# Patient Record
Sex: Male | Born: 1960 | Race: White | Hispanic: No | Marital: Married | Smoking: Never smoker
Health system: Southern US, Community
[De-identification: ages and names within clinical notes are randomized; demographics above are authoritative.]

---

## 2009-08-23 ENCOUNTER — Ambulatory Visit: Payer: Self-pay | Admitting: Sports Medicine

## 2014-08-11 ENCOUNTER — Emergency Department: Payer: Self-pay | Admitting: Emergency Medicine

## 2016-03-13 IMAGING — CR DG CHEST 1V PORT
1 series · 1 of 1 positions shown · non-contrast
Comparison: None.

CLINICAL DATA: Chest pain

EXAM:
PORTABLE CHEST - 1 VIEW

[ap]
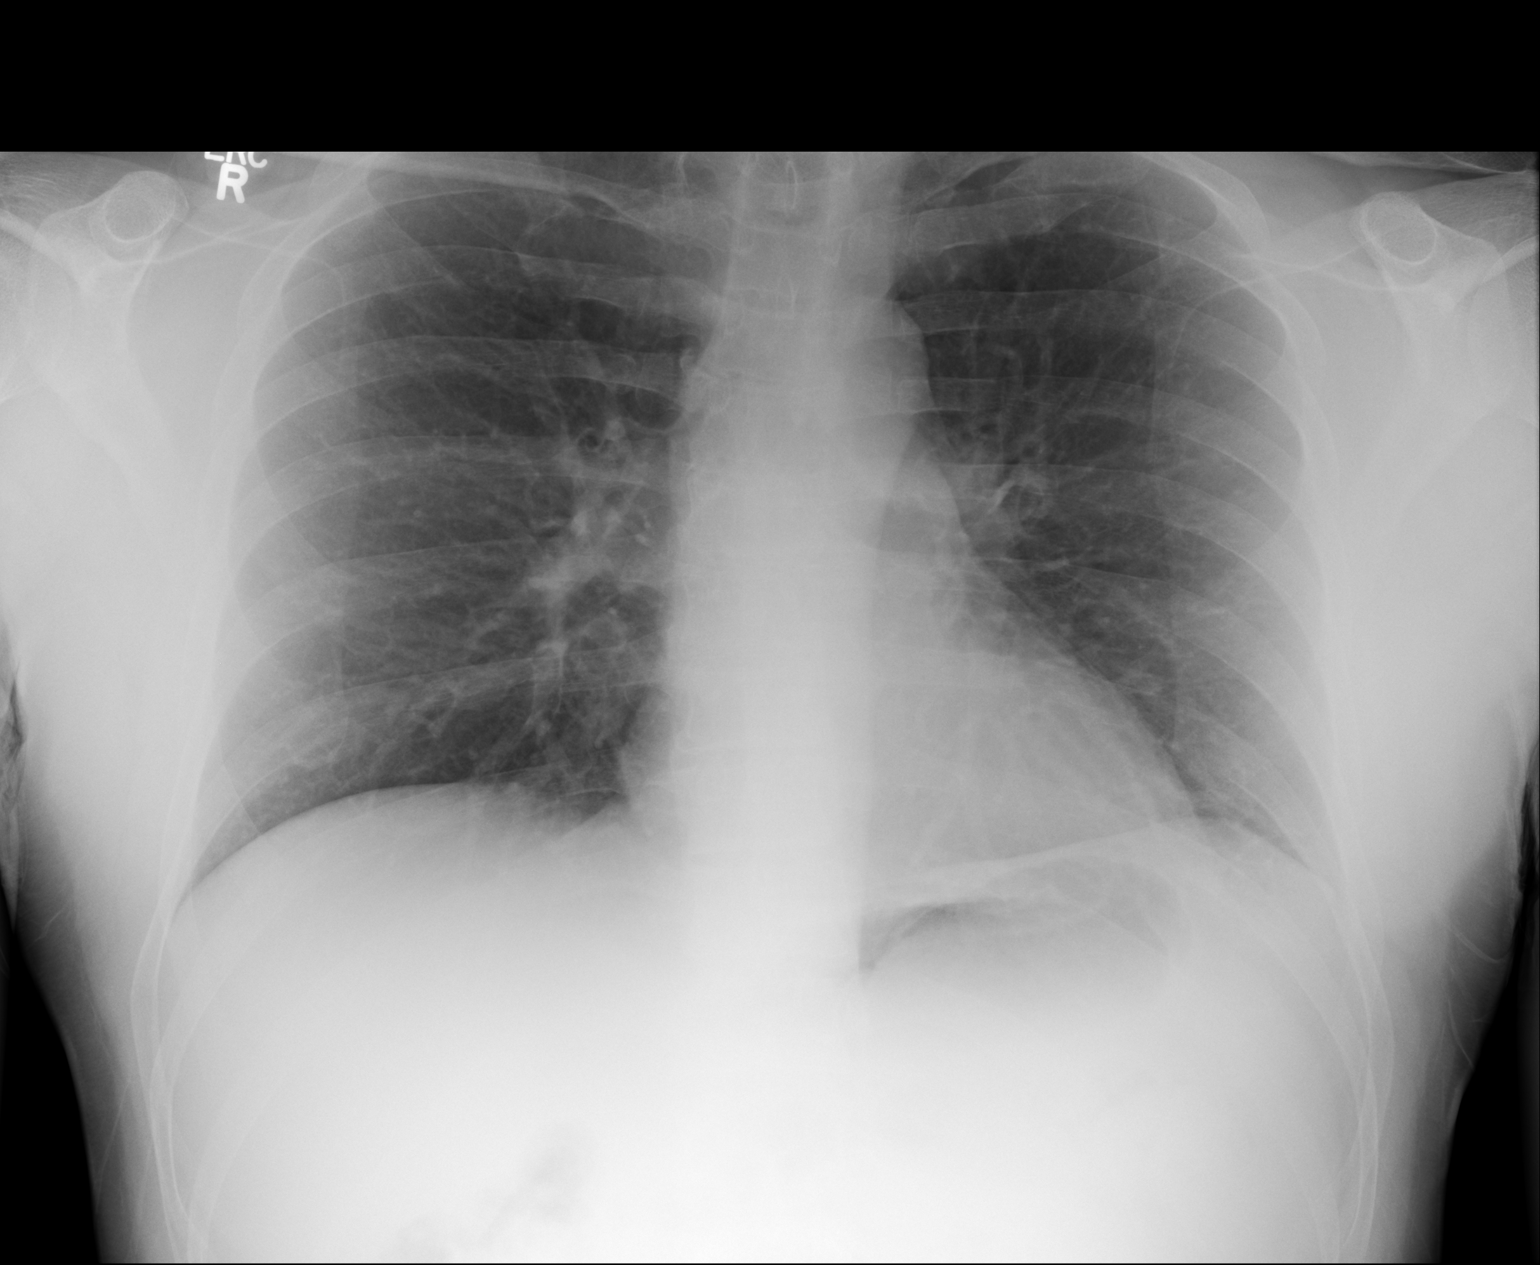

[1 of 1 positions shown; findings below may reference images not displayed]

FINDINGS: The heart size and mediastinal contours are within normal limits.
Both lungs are clear. The visualized skeletal structures are
unremarkable.
IMPRESSION: No active disease.

## 2017-05-23 ENCOUNTER — Encounter: Payer: Self-pay | Admitting: Medical

## 2017-05-23 ENCOUNTER — Ambulatory Visit: Payer: Self-pay | Admitting: Medical

## 2017-05-23 VITALS — BP 126/82 | HR 67 | Temp 98.1°F | Resp 16 | Ht 72.0 in | Wt 189.0 lb

## 2017-05-23 DIAGNOSIS — R05 Cough: Secondary | ICD-10-CM

## 2017-05-23 DIAGNOSIS — R059 Cough, unspecified: Secondary | ICD-10-CM

## 2017-05-23 DIAGNOSIS — J0100 Acute maxillary sinusitis, unspecified: Secondary | ICD-10-CM

## 2017-05-23 DIAGNOSIS — J069 Acute upper respiratory infection, unspecified: Secondary | ICD-10-CM

## 2017-05-23 MED ORDER — BENZONATATE 100 MG PO CAPS: 100.0000 mg | ORAL_CAPSULE | Freq: Three times a day (TID) | ORAL | 0 refills | Status: DC | PRN

## 2017-05-23 MED ORDER — AMOXICILLIN-POT CLAVULANATE 875-125 MG PO TABS: 1.0000 | ORAL_TABLET | Freq: Two times a day (BID) | ORAL | 0 refills | Status: DC

## 2017-05-23 NOTE — Progress Notes (Signed)
   Subjective:    Patient ID: Mitchell Simon, male    DOB: 08/17/1960, 56 y.o.   MRN: [id number]  HPI  56 yo male nonacute distress with nasal congestion and dsicharge green today. starting on Saturday, cough productive green, sneezing. No fever or chills.  Takes Dayquil and Nyquil as directed for symptoms.  No chest pain or shortness of breath.   Review of Systems  Constitutional: Positive for fatigue (little bit). Negative for chills and fever.  HENT: Positive for postnasal drip, rhinorrhea, sinus pressure and sneezing. Negative for congestion, ear pain, sinus pain and sore throat. Trouble swallowing: augmentin.   Eyes: Negative for discharge and itching.  Respiratory: Positive for cough. Negative for chest tightness, shortness of breath and wheezing.   Cardiovascular: Negative for chest pain, palpitations and leg swelling.  Gastrointestinal: Negative for abdominal pain.  Endocrine: Negative for cold intolerance and heat intolerance.  Genitourinary: Negative for dysuria.  Musculoskeletal: Negative for myalgias.  Skin: Negative for rash.  Allergic/Immunologic: Negative for environmental allergies and food allergies.  Neurological: Positive for light-headedness (from lying down alot over the past two days per patient). Negative for dizziness and syncope.  Hematological: Negative for adenopathy.  Psychiatric/Behavioral: Negative for behavioral problems, self-injury and suicidal ideas. The patient is not nervous/anxious.        Objective:   Physical Exam  Constitutional: He is oriented to person, place, and time. He appears well-developed and well-nourished.  HENT:  Head: Normocephalic and atraumatic.  Right Ear: Hearing and ear canal normal. Tympanic membrane is erythematous.  Left Ear: Hearing, external ear and ear canal normal. A middle ear effusion is present.  Nose: Mucosal edema and rhinorrhea present.  Mouth/Throat: Uvula is midline, oropharynx is clear and moist and mucous  membranes are normal.  Eyes: Conjunctivae, EOM and lids are normal. Pupils are equal, round, and reactive to light.  Neck: Normal range of motion. Neck supple.  Cardiovascular: Normal rate, regular rhythm and normal heart sounds. Exam reveals no gallop and no friction rub.  No murmur heard. Pulmonary/Chest: Effort normal and breath sounds normal.  Lymphadenopathy:    He has no cervical adenopathy.  Neurological: He is alert and oriented to person, place, and time.  Skin: Skin is warm and dry.  Psychiatric: He has a normal mood and affect. His behavior is normal. Judgment and thought content normal.  Nursing note and vitals reviewed.  Left sided turbinate swelling , bilateral erythema, yellow/ green d/c on the left side       Assessment & Plan:  Sinusitis/ Upper Respiratory Infection/ cough Meds ordered this encounter  Medications  . amoxicillin-clavulanate (AUGMENTIN) 875-125 MG tablet    Sig: Take 1 tablet by mouth 2 (two) times daily.    Dispense:  20 tablet    Refill:  0  . benzonatate (TESSALON PERLES) 100 MG capsule    Sig: Take 1 capsule (100 mg total) by mouth 3 (three) times daily as needed for cough.    Dispense:  30 capsule    Refill:  0    OTC Zyrtec or Claritin take as directed. Not to take Claritin D with Dayquil or Nyquil. OTC  Flonase take as directed. Return to the clinic in 3-5 days if not improving.  Patient verbalizes understanding and has no questions at discharge.

## 2017-05-23 NOTE — Patient Instructions (Addendum)
Cough, Adult A cough helps to clear your throat and lungs. A cough may last only 2-3 weeks (acute), or it may last longer than 8 weeks (chronic). Many different things can cause a cough. A cough may be a sign of an illness or another medical condition. Follow these instructions at home:  Pay attention to any changes in your cough.  Take medicines only as told by your doctor. ? If you were prescribed an antibiotic medicine, take it as told by your doctor. Do not stop taking it even if you start to feel better. ? Talk with your doctor before you try using a cough medicine.  Drink enough fluid to keep your pee (urine) clear or pale yellow.  If the air is dry, use a cold steam vaporizer or humidifier in your home.  Stay away from things that make you cough at work or at home.  If your cough is worse at night, try using extra pillows to raise your head up higher while you sleep.  Do not smoke, and try not to be around smoke. If you need help quitting, ask your doctor.  Do not have caffeine.  Do not drink alcohol.  Rest as needed. Contact a doctor if:  You have new problems (symptoms).  You cough up yellow fluid (pus).  Your cough does not get better after 2-3 weeks, or your cough gets worse.  Medicine does not help your cough and you are not sleeping well.  You have pain that gets worse or pain that is not helped with medicine.  You have a fever.  You are losing weight and you do not know why.  You have night sweats. Get help right away if:  You cough up blood.  You have trouble breathing.  Your heartbeat is very fast. This information is not intended to replace advice given to you by your health care provider. Make sure you discuss any questions you have with your health care provider. Document Released: 02/24/2011 Document Revised: 11/19/2015 Document Reviewed: 08/20/2014 Elsevier Interactive Patient Education  2018 [employer] Inc. Upper Respiratory Infection,  Adult Most upper respiratory infections (URIs) are caused by a virus. A URI affects the nose, throat, and upper air passages. The most common type of URI is often called "the common cold." Follow these instructions at home:  Take medicines only as told by your doctor.  Gargle warm saltwater or take cough drops to comfort your throat as told by your doctor.  Use a warm mist humidifier or inhale steam from a shower to increase air moisture. This may make it easier to breathe.  Drink enough fluid to keep your pee (urine) clear or pale yellow.  Eat soups and other clear broths.  Have a healthy diet.  Rest as needed.  Go back to work when your fever is gone or your doctor says it is okay. ? You may need to stay home longer to avoid giving your URI to others. ? You can also wear a face mask and wash your hands often to prevent spread of the virus.  Use your inhaler more if you have asthma.  Do not use any tobacco products, including cigarettes, chewing tobacco, or electronic cigarettes. If you need help quitting, ask your doctor. Contact a doctor if:  You are getting worse, not better.  Your symptoms are not helped by medicine.  You have chills.  You are getting more short of breath.  You have brown or red mucus.  You have yellow or   brown discharge from your nose.  You have pain in your face, especially when you bend forward.  You have a fever.  You have puffy (swollen) neck glands.  You have pain while swallowing.  You have white areas in the back of your throat. Get help right away if:  You have very bad or constant: ? Headache. ? Ear pain. ? Pain in your forehead, behind your eyes, and over your cheekbones (sinus pain). ? Chest pain.  You have long-lasting (chronic) lung disease and any of the following: ? Wheezing. ? Long-lasting cough. ? Coughing up blood. ? A change in your usual mucus.  You have a stiff neck.  You have changes in  your: ? Vision. ? Hearing. ? Thinking. ? Mood. This information is not intended to replace advice given to you by your health care provider. Make sure you discuss any questions you have with your health care provider. Document Released: 11/30/2007 Document Revised: 02/14/2016 Document Reviewed: 09/18/2013 Elsevier Interactive Patient Education  2018 [employer]. Sinusitis, Adult Sinusitis is soreness and inflammation of your sinuses. Sinuses are hollow spaces in the bones around your face. They are located:  Around your eyes.  In the middle of your forehead.  Behind your nose.  In your cheekbones.  Your sinuses and nasal passages are lined with a stringy fluid (mucus). Mucus normally drains out of your sinuses. When your nasal tissues get inflamed or swollen, the mucus can get trapped or blocked so air cannot flow through your sinuses. This lets bacteria, viruses, and funguses grow, and that leads to infection. Follow these instructions at home: Medicines  Take, use, or apply over-the-counter and prescription medicines only as told by your doctor. These may include nasal sprays.  If you were prescribed an antibiotic medicine, take it as told by your doctor. Do not stop taking the antibiotic even if you start to feel better. Hydrate and Humidify  Drink enough water to keep your pee (urine) clear or pale yellow.  Use a cool mist humidifier to keep the humidity level in your home above 50%.  Breathe in steam for 10-15 minutes, 3-4 times a day or as told by your doctor. You can do this in the bathroom while a hot shower is running.  Try not to spend time in cool or dry air. Rest  Rest as much as possible.  Sleep with your head raised (elevated).  Make sure to get enough sleep each night. General instructions  Put a warm, moist washcloth on your face 3-4 times a day or as told by your doctor. This will help with discomfort.  Wash your hands often with soap and water. If  there is no soap and water, use hand sanitizer.  Do not smoke. Avoid being around people who are smoking (secondhand smoke).  Keep all follow-up visits as told by your doctor. This is important. Contact a doctor if:  You have a fever.  Your symptoms get worse.  Your symptoms do not get better within 10 days. Get help right away if:  You have a very bad headache.  You cannot stop throwing up (vomiting).  You have pain or swelling around your face or eyes.  You have trouble seeing.  You feel confused.  Your neck is stiff.  You have trouble breathing. This information is not intended to replace advice given to you by your health care provider. Make sure you discuss any questions you have with your health care provider. Document Released: 11/30/2007 Document Revised:  02/07/2016 Document Reviewed: 04/08/2015 Elsevier Interactive Patient Education  2018 Elsevier Inc. Upper Respiratory Infection, Adult Most upper respiratory infections (URIs) are caused by a virus. A URI affects the nose, throat, and upper air passages. The most common type of URI is often called "the common cold." Follow these instructions at home:  Take medicines only as told by your doctor.  Gargle warm saltwater or take cough drops to comfort your throat as told by your doctor.  Use a warm mist humidifier or inhale steam from a shower to increase air moisture. This may make it easier to breathe.  Drink enough fluid to keep your pee (urine) clear or pale yellow.  Eat soups and other clear broths.  Have a healthy diet.  Rest as needed.  Go back to work when your fever is gone or your doctor says it is okay. ? You may need to stay home longer to avoid giving your URI to others. ? You can also wear a face mask and wash your hands often to prevent spread of the virus.  Use your inhaler more if you have asthma.  Do not use any tobacco products, including cigarettes, chewing tobacco, or electronic  cigarettes. If you need help quitting, ask your doctor. Contact a doctor if:  You are getting worse, not better.  Your symptoms are not helped by medicine.  You have chills.  You are getting more short of breath.  You have brown or red mucus.  You have yellow or brown discharge from your nose.  You have pain in your face, especially when you bend forward.  You have a fever.  You have puffy (swollen) neck glands.  You have pain while swallowing.  You have white areas in the back of your throat. Get help right away if:  You have very bad or constant: ? Headache. ? Ear pain. ? Pain in your forehead, behind your eyes, and over your cheekbones (sinus pain). ? Chest pain.  You have long-lasting (chronic) lung disease and any of the following: ? Wheezing. ? Long-lasting cough. ? Coughing up blood. ? A change in your usual mucus.  You have a stiff neck.  You have changes in your: ? Vision. ? Hearing. ? Thinking. ? Mood. This information is not intended to replace advice given to you by your health care provider. Make sure you discuss any questions you have with your health care provider. Document Released: 11/30/2007 Document Revised: 02/14/2016 Document Reviewed: 09/18/2013 Elsevier Interactive Patient Education  2018 [employer]

## 2017-07-13 ENCOUNTER — Ambulatory Visit: Payer: Self-pay | Admitting: Adult Health

## 2017-07-13 ENCOUNTER — Encounter: Payer: Self-pay | Admitting: Adult Health

## 2017-07-13 VITALS — BP 120/70 | HR 61 | Temp 98.0°F | Resp 16 | Ht 72.0 in | Wt 192.0 lb

## 2017-07-13 DIAGNOSIS — Z7184 Encounter for health counseling related to travel: Secondary | ICD-10-CM

## 2017-07-13 LAB — COMPREHENSIVE METABOLIC PANEL
ALBUMIN: 4.7 g/dL (ref 3.5–5.5)
ALT: 23 IU/L (ref 0–44)
AST: 23 IU/L (ref 0–40)
Albumin/Globulin Ratio: 1.5 (ref 1.2–2.2)
Alkaline Phosphatase: 54 IU/L (ref 39–117)
BUN / CREAT RATIO: 11 (ref 9–20)
BUN: 11 mg/dL (ref 6–24)
Bilirubin Total: 0.6 mg/dL (ref 0.0–1.2)
CALCIUM: 9.8 mg/dL (ref 8.7–10.2)
CO2: 27 mmol/L (ref 20–29)
Chloride: 102 mmol/L (ref 96–106)
Creatinine, Ser: 1.02 mg/dL (ref 0.76–1.27)
GFR calc Af Amer: 96 mL/min/{1.73_m2} (ref >59)
GFR calc non Af Amer: 83 mL/min/{1.73_m2} (ref >59)
GLOBULIN, TOTAL: 3.2 g/dL (ref 1.5–4.5)
GLUCOSE: 89 mg/dL (ref 65–99)
Potassium: 4.3 mmol/L (ref 3.5–5.2)
Sodium: 141 mmol/L (ref 134–144)
Total Protein: 7.9 g/dL (ref 6.0–8.5)

## 2017-07-13 MED ORDER — ATOVAQUONE-PROGUANIL HCL 250-100 MG PO TABS: 1.0000 | ORAL_TABLET | Freq: Every day | ORAL | 0 refills | Status: AC

## 2017-07-13 NOTE — Progress Notes (Signed)
Subjective:     Patient ID: Mitchell Simon, male   DOB: 07/06/1960, 57 y.o.   MRN: [id number]  HPI   Patient is a 57 year old male who presents to the clinic in no acute distress.  Patient is here for an appointment for travel counseling.  He reports that he would like to have malaria prophylaxis prior to his trip. Denies having any recent labs drawn, no labs documented in epic.  He is advised he will need kidney function evaluated prior to prescribing Malarone and is in agreement for lab to be drawn today. He denies any recent illness or recent hospitalizations.   He denies the need for traveler's diarrhea medication [place] [facility] [place] [facility] and has Cipro stocked  and readily available if needed. Patient reports that he is healthy and is not receiving treatments for any medical problem at this time.  He denies any kidney or liver disease. No current outpatient medications on file. He denies any current medications on a daily basis. Reviewed immunization history that is documented in Medicat EHR - previous documentation system with patient. Tdap 1 -2 - 2014. Hepatis A  Was given  06/09/2013 and 06/28/2012. Typhoid - oral was last taken 06/28/2012 and this date is explained to be recently expired.  He reports he does not want to receive prescription for Typhoid oral or Typhoid vaccine today in the clinic.   He has reported in Medicat EHR that his Hepatitis B/ 3 series vaccine has been completed- no documentation in system of dates. Today he  reassures provider this is completed.   No other immunization records available and patient does not have with him - he reports he is up to date on his vaccinations.    Patient reports that he leaves for [place] on July 15, 2017 and will return on July 22, 2017 for a total of 7 days.  Patient denies any known layovers in other areas during flight patient reports he is going for a mission trip.  He has previously been to [place] 3  times in the last 5 years.  He also reports that he has been to [place] 20 years ago  Blood pressure 120/70, pulse 61, temperature 98 F (36.7 C), resp. rate 16, height 6' (1.829 m), weight 192 lb (87.1 kg), SpO2 98 %.   Review of Systems  Constitutional: Negative.   HENT: Negative.   Respiratory: Negative.   Cardiovascular: Negative.   Gastrointestinal: Negative.   Genitourinary: Negative.   Musculoskeletal: Negative.   Skin: Negative.   Neurological: Negative.   Hematological: Negative.   Psychiatric/Behavioral: Negative.        Objective:   Physical Exam  Constitutional: He is oriented to person, place, and time. He appears well-developed and well-nourished. No distress.  HENT:  Head: Normocephalic and atraumatic.  Eyes: Conjunctivae are normal. Pupils are equal, round, and reactive to light.  Neck: Normal range of motion. Neck supple. No JVD present. No tracheal deviation present.  Cardiovascular: Normal rate, regular rhythm, normal heart sounds and intact distal pulses. Exam reveals no gallop and no friction rub.  No murmur heard. Pulmonary/Chest: Effort normal and breath sounds normal. No stridor.  Abdominal: Soft. Bowel sounds are normal.  Musculoskeletal: Normal range of motion.  Patient moves on and off of exam table and in room without difficulty. Gait is normal in hall and in room. Patient is oriented to person place time and situation. Patient answers questions appropriately and engages in conversation.   Neurological:  He is alert and oriented to person, place, and time. He has normal reflexes. No cranial nerve deficit. Coordination normal.  Skin: Skin is warm and dry. He is not diaphoretic.  Psychiatric: He has a normal mood and affect. His speech is normal and behavior is normal. Judgment and thought content normal. Cognition and memory are normal.  Nursing note and vitals reviewed.      Assessment:     Travel advice encounter - Plan: Comprehensive metabolic  panel     Plan:     Check CMP at today's visit discussed with office manager and will run stat as patient is leaving on Saturday and needs this medication prior to then  Patient declines typhoid vaccine or prescription for oral vaccine at this time, patient is educated that his Typhoid is expired per guidelines and this can set him up for risk factors including but not limited to extreme illness and even the risk of death.   Also discussed guidelines per Centers for Disease Control ( CDC) recommendations for [place] including Typhoid, all current immunizations per recommendations, TDAP ( he is aware that his is up to date unless he has an injury or cut would need one if greater than five years since last TDAP- 06/28/2012, Hepatitis A documented as complete, Hepatitis B patient reports complete and may have titer drawn for this to prove immunity,   Also discussed with patient CDC recommendation of Cholera vaccine and yellow fever alert on CDC web site not typically affecting the area of [place] he is traveling to at this time, he is advised that this clinic is not a specialty travel clinic and he should also cal [facility] for evaluation prior to travel.   ( Provider did call the [facility] and Glendell DockerSusan Kirks RN returned call that they dod not have Cholera vaccine available and that yellow fever only recommended by their physicians if lay overs in areas of epidemic Travel Clinic  which this patient denies.  He is advised that is this providers recommendation that he still call the travel clinic and speak with them directly prior to travel.    Travel clinic handout given to patient with contact information for travel clinic in [facility] in [place], see media tab.  Patient verbalizes understanding of risk versus benefits of not receiving typhoid vaccine and or oral typhoid and declines either at this time. He also verbalizes understanding that he should follow  clinics guidelines of turning in paperwork for travel evaluation 3-4 weeks prior to travel and scheduling an appointment during that time as well.  He reports that he will call the office to schedule an appointment for next travel health ahead of time as he leaves Saturday for this trip to [place].  Discussed immunity will take over 2 weeks to take effect and this should be taken into consideration for any future trips to lessen his risk. Verbalized understanding, will pend order for Malarone sent to pharmacy once CMP is received and kidney function verified.  Patient prefers [facility]

## 2017-07-13 NOTE — Patient Instructions (Signed)
Malaria Malariais a disease that is caused by a type of single-celled germ that can live inside a person's body (parasite). The malaria parasite is from the Plasmodium family of parasites. This parasite can get into a person's blood when he or she is bitten by a certain type of mosquito (Anopheles mosquito). These mosquitoes are most common in tropical areas of the world. Usually, they are not found in the [place]. If you are bitten by an infected mosquito, the parasites can travel through your blood to your liver. The parasites mature in your liver, then they are released into your blood. They can then invade red blood cells. The parasites multiply inside red blood cells and cause the cells to break open (rupture). This infects more red blood cells. Losing red blood cells may cause you to have a low red blood cell count (anemia). What are the causes? This condition is caused by a Plasmodium parasite. Four different types of the parasite can cause disease in humans. Most cases of malaria come from a mosquito bite, but the disease can also be passed from person to person through blood. What increases the risk? This condition is more likely to develop in:  People who live or travel in an area of the world where malaria is common.  People who receive donated blood (transfusion) or an organ transplant that is contaminated with infected blood cells.  People who share needles with a person who is infected with malaria.  Children.  Pregnant women.  People who have never been exposed to malaria parasites before. These people have not built up protection (immunity) against the parasites.  What are the signs or symptoms? Symptoms can vary depending on which parasite caused the infection. Symptoms usually come and go or occur in cycles. The first symptoms of this condition usually start 10 days to 4 weeks after the mosquito bite. Symptoms for all types of malaria usually happen in this  order:  Chills, along with headache, muscle aches, fatigue, and nausea.  Fever, along with hot and dry skin.  Drenching sweats, along with weakness and exhaustion.  Other symptoms include:  Diarrhea or bloody stools (feces).  Yellowing of the skin and the whites of the eyes (jaundice).  Enlarged spleen or liver.  Severe symptoms include:  Trouble breathing.  Seizures.  Loss of consciousness (coma).  Bleeding.  How is this diagnosed? This condition may be diagnosed based on:  Your symptoms and medical history. Your health care provider may suspect malaria if you have been living or traveling in an area where the disease is common.  A physical exam.  Blood tests to confirm the diagnosis. These may include: ? Examining a blood sample under a microscope. This is the most common way to diagnose malaria. Each type of parasite looks different under the microscope. Identifying which parasite is causing your infection will help your health care provider to determine which medicines will work best. ? Complete blood count (CBC) to check for anemia.  How is this treated? This condition may be treated with many different medicines and combinations of medicines, often requiring treatment in the hospital. The best treatment for you will depend on:  Which type of parasite is causing your infection.  Whether various medicines are effective against it.  How you got the infection.  How severe your infection is.  Your age and your general health.  Follow these instructions at home:  Take over-the-counter and prescription medicines only as told by your health care provider.  Rest at home until your symptoms are under control.  Drink enough fluid to keep your urine clear or pale yellow.  Keep all follow-up visits as told by your health care provider. This is important. How is this prevented?  If you will be traveling to an area where malaria is common: ? Visit the website of  the Centers for Disease Control and Prevention (CDC) to check your risk: KeyPreview.se ? Make an appointment with your health care provider at least 2 weeks before you leave. You may be given a medicine to help prevent malaria.  You can also prevent malaria by: ? Using insect repellent. ? Staying indoors after daytime starts to turn to night or dusk. ? Wearing protective clothing that covers your arms and legs. ? Hanging a mosquito net over your bed. Contact a health care provider if:  You have an attack of malaria symptoms.  You develop jaundice. Get help right away if:  You have a seizure.  You have bleeding.  You have trouble breathing. This information is not intended to replace advice given to you by your health care provider. Make sure you discuss any questions you have with your health care provider. Document Released: 01/26/2004 Document Revised: 01/03/2016 Document Reviewed: 11/20/2015 Elsevier Interactive Patient Education  2018 [employer]. Hepatitis A Hepatitis A is a viral infection of the liver. The virus causes inflammation in the liver and it can be passed from person to person (is contagious). Most cases of hepatitis A are fairly mild and people recover fully. The hepatitis A vaccine can prevent this condition. What are the causes? This condition is caused by the hepatitis A virus (HAV). The virus may be spread by:  Drinking or eating unclean (contaminated) food or water.  Having sex with with someone who is infected.  Coming into contact with the stool (feces) of a person who is infected and passing the virus from your hands to your mouth.  What increases the risk? The following factors make you more likely to develop this condition:  Having contact with contaminated needles or syringes. This may happen during: ? Acupuncture. ? Tattooing. ? Body piercing. ? Injecting drugs.  Not having access to clean water or food.  Working  at a day care or nursing home. Working in these facilities increases the risk of getting this infection because you are in contact with feces during diaper changes or general hygiene practices.  Having HIV (human immunodeficiency virus) or AIDS (acquired immunodeficiency syndrome).  Living in or traveling to countries where hepatitis A is common.  Being a man who has sex with men.  Having oral or anal sex.  Having hemophilia or another blood clotting factor disorder.  Having long-term (chronic) liver disease.  What are the signs or symptoms? Symptoms of this condition include:  Loss of appetite.  Fatigue.  Nausea.  Vomiting.  Stomach pain.  Dark yellow urine.  Yellowing of the skin and eyes (jaundice).  Fever.  Itchy skin.  Light-colored bowel movements.  Joint pain.  In some cases, you may not have any symptoms. How is this diagnosed? This condition is diagnosed based on:  A physical exam.  Your medical history.  Blood tests.  How is this treated? This condition usually goes away on its own over several weeks or months. There is no specific treatment for the disease after the virus has caused the infection. Severe cases of hepatitis A may require hospitalization to treat dehydration and to monitor liver function, but this is  rare. Follow these instructions at home: Medicines  Take over-the-counter and prescription medicines only as told by your health care provider.  Do not take over-the-counter medicines that contain acetaminophen.  Do not take any new medicines, including over-the-counter medicines and supplements, unless approved by your health care provider. Lifestyle  Rest. Make sure you: ? Get plenty of sleep. Avoid staying up late. ? Keep the same bedtime hours on weekends and weekdays. ? Take daytime naps or rest breaks when you feel tired.  Do not have sex unless approved by your health care provider.  Eat a balanced diet with plenty of  fruits and vegetables, whole grains, and low-fat (lean) meats or other non-meat proteins (such as beans or tofu).  Do not drink alcohol until your health care provider approves. General instructions  Wash your hands frequently with soap and water, especially after using the bathroom, after changing diapers, and before handling food or water. If soap and water are not available, use hand sanitizer.  Tell your health care provider about all of the people you live with or with whom you have close contact. Your health care provider may recommend that they receive the hepatitis A vaccine.  Follow your health care provider's instructions about how to avoid spreading the virus.  Ask your health care provider when you may return to school or work.  Keep all follow-up visits as told by your health care provider. This is important. How is this prevented?  Get the hepatitis A vaccine. This helps prevent the hepatitis A infection.  If you have been recently exposed to hepatitis A, your health care provider may recommend that you get a shot of human immunoglobulin or the hepatitis A vaccine. This may help you prevent hepatitis A.  Wash your hands frequently with soap and water, especially after using the bathroom or changing diapers, and before handling food or water. If soap and water are not available, use hand sanitizer.  If you travel to a developing country: ? Avoid raw or under-cooked food. ? Drink bottled water only. ? Use bottled water to brush your teeth, make ice cubes, and wash fruits and vegetables.  Practice safe sex. Always use condoms when having oral, vaginal, or anal sex. Contact a health care provider if:  You have a fever.  Your symptoms get worse. Get help right away if:  You are unable to eat or drink.  You cannot eat or drink without vomiting.  You feel confused.  Your jaundice gets worse.  You are very sleepy or have trouble waking up.  You have uncontrolled  bleeding or bruising. Summary  Hepatitis A is a viral infection of the liver. The virus causes inflammation in the liver and it can be passed from person to person (is contagious).  The hepatitis A virus (HAV) can be spread by drinking or eating unclean (contaminated) food or water.  You should not take any new medicines, including over-the-counter medicines and supplements, unless they are approved by your health care provider.  To help prevent hepatitis A, wash your hands frequently with soap and water, especially after using the bathroom or changing diapers, and before handling food or water. If soap and water are not available, use hand sanitizer. This information is not intended to replace advice given to you by your health care provider. Make sure you discuss any questions you have with your health care provider. Document Released: 06/10/2000 Document Revised: 07/19/2016 Document Reviewed: 07/19/2016 Elsevier Interactive Patient Education  2018 [employer]. Hepatitis  B Hepatitis B is a viral infection of the liver. There are two kinds of hepatitis B:  Acute hepatitis B. This lasts for six months or less.  Chronic hepatitis B. This lasts for more than six months. Chronic hepatitis B can lead to liver failure, scarring of the liver (cirrhosis), or liver cancer.  Acute hepatitis B can turn into chronic hepatitis B. Most adults with acute hepatitis B do not develop chronic hepatitis B. Infants and young children who get hepatitis B are more likely to develop chronic hepatitis B than adults. The hepatitis B vaccine can prevent this condition. What are the causes? This condition is caused by the hepatitis B virus (HBV). The virus may spread from person to person (is contagious) through:  Blood.  Childbirth. A woman who has hepatitis B can pass it to her baby during birth.  Bodily fluids such as breast milk, tears, semen, vaginal fluids, and saliva.  What increases the risk? The  following factors may make you more likely to develop this condition:  Having contact with unclean (contaminated) needles or syringes. This may result from: ? Acupuncture. ? Tattooing. ? Body piercing. ? Injecting drugs.  Having unprotected sex with someone who is infected.  Living with or having close contact with a person who has hepatitis B.  Working in a job that involves contact with blood or bodily fluids, such as health care.  Traveling to a country that has many cases of hepatitis B.  Being on treatment to filter your blood (kidney dialysis).  Having a history of blood transfusions or organ transplants.  What are the signs or symptoms? Symptoms of this condition may include:  Loss of appetite.  Fatigue.  Nausea.  Vomiting.  Stomach pain.  Dark yellow urine.  Yellowish skin and eyes (jaundice).  Fever.  Light-colored or gray bowel movements.  Joint pain.  In some cases, you may not have any symptoms. How is this diagnosed? This condition is diagnosed based on:  A physical exam.  Your medical history.  Blood tests.  How is this treated? Treatment for chronic hepatitis B may include antiviral medicine. This medicine may help:  Lower your risk of liver failure, cirrhosis, or liver cancer.  Lower your ability to infect others with hepatitis B.  You will need to avoid alcohol and medicines that can be hard for the liver to break down (metabolize). This helps prevent further injury to your liver. Follow these instructions at home: Medicines  Take over-the-counter and prescription medicines only as told by your health care provider.  Take your antiviral medicine as told by your health care provider. Do not stop taking the antiviral even if you start to feel better.  Do not take any over-the-counter medicines that contain acetaminophen.  Do not take any new medicines, including over-the-counter medicines for fever or pain, unless approved by your  health care provider. Activity  Rest as needed.  Do not have sex unless approved by your health care provider.  Ask your health care provider when you may return to school or work. Eating and drinking  Eat a balanced diet with plenty of fruits and vegetables, whole grains, and lowfat (lean) meats or other non-meat proteins (such as beans or tofu).  Drink enough fluids to keep your urine clear or pale yellow.  Avoid alcohol. General instructions  Do not share toothbrushes, nail clippers, or razors.  Wash your hands frequently with soap and water. If soap and water are not available, use hand sanitizer.  Keep all follow-up visits as told by your health care provider. This is important. How is this prevented?  Get the hepatitis B vaccine. This helps prevent the hepatitis B infection.  Wash your hands frequently with soap and water. If soap and water are not available, use hand sanitizer.  Do not share needles or syringes.  Practice safe sex and use condoms.  Avoid handling blood or bodily fluids without gloves or other protection.  Avoid getting tattoos or piercings in shops or other locations that are not clean. Contact a health care provider if:  You develop a rash.  You develop jaundice, or your chronic jaundice becomes more severe.  You have a fever. Get help right away if:  You are unable to eat or drink.  You have a fever along with nausea or vomiting.  You feel confused.  You have trouble breathing.  Your skin, throat, mouth, or face becomes swollen.  You have jerky movements that you cannot control (seizure).  You become very sleepy or have trouble waking up.  Your stomach becomes very swollen. Summary  Hepatitis B is a viral infection of the liver. There are two kinds of hepatitis B: acute and chronic.  The hepatitis B virus (HBV) can be passed from person to person (is contagious).  You should not take any new medicines, including  over-the-counter medicines for fever or pain, unless approved by your health care provider.  To help prevent hepatitis B, wash your hands frequently with soap and water. If soap and water are not available, use hand sanitizer. This information is not intended to replace advice given to you by your health care provider. Make sure you discuss any questions you have with your health care provider. Document Released: 06/10/2000 Document Revised: 07/19/2016 Document Reviewed: 07/19/2016 Elsevier Interactive Patient Education  2018 [employer]. Cholera Cholera is an infection of the intestines by the bacterium Vibrio cholerae. The infection can cause excessive passing of watery stools (diarrhea), vomiting, and other symptoms, often leading to severe dehydration. Dehydration is when you lose more fluids from your body than you take in. This can be life-threatening. If you have cholera, it is very important to get treatment as soon as possible. What are the causes? This condition is caused by the bacterium Vibrio cholerae. You can get infected by:  Drinking unclean (contaminated) water.  Eating contaminated food.  Coming into contact with the stool of an infected person and passing the bacteria to your mouth.  What increases the risk? This condition is more likely to develop in:  People who live in or have traveled to areas with poor sanitation.  People who live in areas where there is no access to clean water.  What are the signs or symptoms? Symptoms of this condition may include:  Diarrhea.  Vomiting.  Leg or abdominal cramps.  Nausea.  Unusual tiredness.  Weakness.  Dizziness.  Dry and sticky mouth.  Headache.  Smaller amounts of urine than usual.  Being very thirsty.  Having a fever.  In some cases, there are no symptoms of this condition. How is this diagnosed? This condition may be diagnosed with:  A stool culture test. This is a method in which a stool sample  is checked for bacteria under a microscope. This is the most reliable method of diagnosis.  A rapid diagnostic test. This method may be used in areas where there are limited lab testing resources available. It is less reliable than a stool culture test, but it can  be used to provide early signs that a cholera outbreak may be taking place at a certain location. If possible, the results of a rapid test should be confirmed by a stool culture test.  How is this treated? This condition is treated with fluids to keep your body hydrated. Depending on how sick you are, you may be given fluids:  Through your mouth (oral rehydration therapy).  Through an IV tube inserted into one of your veins.  In some cases, you may also be given antibiotic medicine. Follow these instructions at home:  Take over-the-counter and prescription medicines only as told by your health care provider.  If you were prescribed an antibiotic medicine, take it as told by your health care provider. Do not stop taking the antibiotic even if you start to feel better.  Wash your hands with soap and water often, especially after going to the bathroom and before preparing food. If soap and water are not available, use hand sanitizer.  Drink enough fluids to keep your urine clear or pale yellow.  Keep all follow-up visits as told by your health care provider. This is important. How is this prevented?  In areas of cholera contamination or a cholera outbreak: ? Boil your water before drinking it, or drink bottled water. Do not drink any beverage that has ice in it. ? Do not eat raw vegetables. This includes salad. ? Only eat fruit that you have peeled yourself. Do not eat fruit that cannot be peeled, such as peaches. ? Only eat food that is fully cooked. Do not eat raw fish or shellfish. ? Do not eat food from street vendors.  Always wash your hands with soap and water after going to the bathroom and before preparing food. If soap  and water are not available, use hand sanitizer.  Ask your health care provider about getting the cholera vaccine. Contact a health care provider if:  You have watery diarrhea or other symptoms of cholera.  Your symptoms get worse.  You have new symptoms. Get help right away if:  You faint.  You become confused.  Your eyes look sunken.  You have a rapid heartbeat.  Your breathing is faster than normal.  You do not have any tears while crying.  You are not urinating.  Your urine is very dark.  Your skin feels very dry, and if you pinch it, it stays pinched after you let go. This information is not intended to replace advice given to you by your health care provider. Make sure you discuss any questions you have with your health care provider. Document Released: 03/05/2002 Document Revised: 06/06/2016 Document Reviewed: 06/06/2016 Elsevier Interactive Patient Education  2018 [employer]. Typhoid Fever Typhoid fever is an illness that is caused by the bacteria Salmonella typhi, or S. typhi. Typhoid fever can be a life-threatening condition. What are the causes? In most cases, typhoid infection happens after a person drinks water or eats food that is contaminated with S. typhi bacteria. Typhoid fever can be a problem in areas that have poor sanitation. In countries that have large outbreaks, the problem usually results when street vendor food or water supplies become contaminated with the stool (feces) of an infected person. In some cases, an infected person can continue to spread typhoid bacteria even after he or she no longer has symptoms of the illness. A small percentage of people who have recovered from typhoid fever become chronic carriers. A chronic carrier is a person who does not have, or  who no longer has, symptoms of typhoid fever but continues to excrete typhoid bacteria for more than one year. A chronic carrier may not appear to be ill, but he or she can still spread the  bacteria to other people. What are the signs or symptoms? Symptoms of typhoid fever may include:  High fever. At first, the fever comes and goes. It becomes constant after a few days of illness.  Headache.  Muscle aches and weakness.  Extreme tiredness (fatigue).  Stomach cramps with diarrhea or constipation.  Nosebleed.  Nausea.  Vomiting.  Cough.  Rash.  Confusion or delirium.  Swollen abdomen.  Enlarged liver or spleen.  How is this diagnosed? Your health care provider will do a physical exam and ask you about your travel history and your symptoms. This condition is diagnosed by examining a culture of your stool, urine, blood, or other body tissue under a microscope to check for typhoid bacteria. How is this treated? This condition is treated with antibiotic medicines that may be taken by mouth or may be given through an IV tube. Follow these instructions at home:  Take over-the-counter and prescription medicines only as told by your health care provider.  If you were prescribed an antibiotic medicine, finish all of it even if you start to feel better.  Thoroughly wash your hands with antibacterial soap and warm water after: ? You use the toilet. ? You help a child to use the toilet. ? You change a diaper.  Use a bleach-based household cleaner to disinfect any surfaces of your home or living area that might be contaminated.  Do not prepare food for other people until both of these things have happened: ? You have been treated with antibiotics. ? You have been free of all symptoms for at least one week.  Avoid close contact with other people until you are well.  Follow instructions from your health care provider about how to avoid spreading infection to others. How is this prevented?  Before you travel to a country where typhoid fever is a problem, talk with your health care provider or a travel clinic about getting a vaccination against typhoid. Ask the  provider when the vaccination needs to be completed so that there will be enough time for the vaccine to take effect before you begin traveling.  When you travel in an area where typhoid is common, avoid unsafe foods and drinks: ? Only drink sealed bottled drinks and sealed bottled water. ? Do not use ice in drinks unless the ice was made from bottled or boiled water. ? Do not eat raw fruits or vegetables unless they have a peel and you have peeled them yourself. ? Do not eat raw, rare, or undercooked meat, fish, or eggs. ? When possible, cook your own food. ? Only eat foods that have been cooked thoroughly and kept hot until served. ? Do not eat foods or drink beverages from street vendors.  Avoid contact with sick people. Contact a health care provider if:  Your symptoms seem to be getting worse.  You develop new symptoms.  You have a fever.  Your fever gets worse, or it comes back after it had gone away. Get help right away if:  You develop severe vomiting or diarrhea.  You cannot eat or drink without vomiting. This information is not intended to replace advice given to you by your health care provider. Make sure you discuss any questions you have with your health care provider. Document Released: 09/03/2002  Document Revised: 11/24/2015 Document Reviewed: 10/31/2014 Elsevier Interactive Patient Education  2018 [employer]. TravelVaccine Information Vaccines, also called immunizations, can protect you from certain diseases. Vaccines can also prevent the spread of certain infections. It is important to see your health care provider or a travel medicine specialist 4-6 weeks before you travel. This allows time for recommended vaccines to take effect. It also provides enough time for you to get vaccines that must be given in a series over a period of days or weeks. Vaccines for travelers include:  Routine vaccines. These vaccines are standard.  Recommended travel vaccines. These  vaccines are generally recommended before international travel.  Geographically required travel vaccines. These vaccines are necessary before travel to some countries or regions.  If it is less than 4 weeks before you leave, you should still see your health care provider. You might still benefit from vaccines or medicines. What are routine vaccines? Routine vaccines are shots that can protect you from common diseases in many parts of the world. Most routine vaccines are given at certain ages starting in childhood. Routine vaccines also include the annual flu (influenza) vaccine. It is important that you are up to date on your routine vaccines before you travel. You may be advised to get extra doses, also called booster vaccines, such as the Tdap (tetanus, diphtheria, and pertussis). What are recommended vaccines? Recommended travel vaccines change over time. Your health care provider can tell you what vaccines are recommended before your trip. The most common recommended vaccines before travel are hepatitis A and typhoid vaccines. Know your travel schedule when you visit your health care provider. The vaccines that are recommended before foreign travel will depend on several factors, such as:  The country or countries of travel.  Whether you will be traveling to rural areas.  How long you will be traveling.  The season of the year.  Your age. Older adults should get a vaccine against a certain type of pneumonia (pneumococcal) and a vaccine against shingles (herpes zoster).  Your health status.  Your previous vaccines.  The annual influenza vaccine sometimes differs for the [place] and [place] hemispheres. You should:  Get both vaccines if you are traveling to the other hemisphere, and you have a chronic medical condition.  Get the vaccine shortly before or during the flu season, and only if the vaccine in your country differs from the vaccine in your destination country.  Get the  other influenza vaccine either before leaving the country or shortly after arriving at the destination country.  What are geographically required vaccines? Children should be up to date with all of the recommended vaccinations. Parents should follow the standard vaccination guidelines that are recommended by the pediatrician. Some vaccines may be required during an ongoing outbreak of an infectious disease in a country or region. Your health care provider will be able to tell you about any outbreaks and required vaccines. Some examples of required vaccines include: Yellow fever vaccine  Proof of yellow fever immunization is currently required for most people before traveling to certain countries in [place] and [place].  If proof of immunization is incomplete or inaccurate, you could be quarantined, denied entry, or given another dose of vaccine at the travel site.  This vaccine can only be obtained at approved centers.  You should get the yellow fever vaccine at least 10 days before your trip.  After 10 days, most people show immunity to yellow fever.  If it has been longer than 10  years since you received the yellow fever vaccine, another dose is required.  Meningococcal vaccine  Meningococcal immunization may be required prior to travel to parts of [place] and [place].  Proof of meningococcal immunization is required by the [place] of Health for any person older than age 61 who is taking part in [place] or [place].  Visas for traveling to take part in hajj or umrah will not even be issued until there is proof of immunization. You should get this vaccine at least 10 days before your trip.  After 10 days, most people show immunity.  If it has been longer than 3 years since your last immunization, another dose is required.  Some travel circumstances may require additional vaccination with the following vaccines:  Hepatitis B.  Rabies.  Tick-borne  encephalitis.  Malaria.  Where to find more information:  Centers for Disease Control and Prevention (CDC): FootballExhibition.com.br  World Health Organization [facility]): https://castaneda-walker.com/ This information is not intended to replace advice given to you by your health care provider. Make sure you discuss any questions you have with your health care provider. Document Released: 06/01/2009 Document Revised: 03/08/2016 Document Reviewed: 11/17/2015 Elsevier Interactive Patient Education  [employer].

## 2017-07-13 NOTE — Addendum Note (Signed)
Addended by: Berniece PapFLINCHUM, Vinie Charity S on: 07/13/2017 12:59 PM   Modules accepted: Orders

## 2020-06-11 ENCOUNTER — Encounter: Payer: Self-pay | Admitting: Dermatology

## 2022-07-06 DIAGNOSIS — E785 Hyperlipidemia, unspecified: Secondary | ICD-10-CM | POA: Insufficient documentation

## 2022-07-11 ENCOUNTER — Other Ambulatory Visit: Payer: Self-pay | Admitting: Internal Medicine

## 2022-07-11 DIAGNOSIS — Z0000 Encounter for general adult medical examination without abnormal findings: Secondary | ICD-10-CM

## 2022-07-11 DIAGNOSIS — E785 Hyperlipidemia, unspecified: Secondary | ICD-10-CM

## 2022-07-15 ENCOUNTER — Ambulatory Visit
Admission: RE | Admit: 2022-07-15 | Discharge: 2022-07-15 | Disposition: A | Discharge status: Home / Self Care | Payer: Self-pay | Source: Ambulatory Visit | Attending: Internal Medicine | Admitting: Internal Medicine

## 2022-07-15 DIAGNOSIS — E785 Hyperlipidemia, unspecified: Secondary | ICD-10-CM | POA: Insufficient documentation

## 2022-07-15 DIAGNOSIS — Z0000 Encounter for general adult medical examination without abnormal findings: Secondary | ICD-10-CM | POA: Insufficient documentation

## 2022-07-18 ENCOUNTER — Ambulatory Visit (INDEPENDENT_AMBULATORY_CARE_PROVIDER_SITE_OTHER): Payer: Self-pay | Admitting: Physician Assistant

## 2022-07-18 DIAGNOSIS — U071 COVID-19: Secondary | ICD-10-CM

## 2022-07-18 NOTE — Progress Notes (Signed)
Virtual Visit Consent   Mitchell Simon, you are scheduled for a virtual visit with a [facility] provider today. Just as with appointments in the office, your consent must be obtained to participate. Your consent will be active for this visit and any virtual visit you may have with one of our providers in the next 365 days. If you have a MyChart account, a copy of this consent can be sent to you electronically.  As this is a virtual telephone visit which doesn't allow your provider to perform a traditional examination and may limit your provider's ability to fully assess your condition. If your provider identifies any concerns that need to be evaluated in person or the need to arrange testing (such as labs, EKG, etc.), we will make arrangements to do so. Although advances in technology are sophisticated, we cannot ensure that it will always work on either your end or our end. If the connection with a video visit is poor, the visit may have to be switched to a telephone visit. With either a video or telephone visit, we are not always able to ensure that we have a secure connection.  By engaging in this virtual visit, you consent to the provision of healthcare and authorize for your insurance to be billed (if applicable) for the services provided during this visit. Depending on your insurance coverage, you may receive a charge related to this service.  I need to obtain your verbal consent now. Are you willing to proceed with your visit today? Mitchell Simon has provided verbal consent on 07/18/2022 for a virtual visit (video or telephone). Mitchell Simon, [place]  Date: 07/18/2022 1:07 PM  Virtual Visit via Telephone Note   I, Mitchell Simon, connected with  Jamoni Hewes  ([id number], [birth year]) on 07/18/22 at  1:30 PM EST by a telephone and verified that I am speaking with the correct person using two identifiers.  Location: Patient: Virtual Visit Location Patient: Home Provider: Virtual Visit Location Provider:  Office/Clinic   I discussed the limitations of evaluation and management by telemedicine and the availability of in person appointments. The patient expressed understanding and agreed to proceed.    History of Present Illness: Mitchell Simon is a 62 y.o. who identifies as a male who was assigned male at birth, and is being seen today for covid .  HPI: 62 y/o M c/o cough, fever, and congestion x 3 days. He tested positive for covid yesterday at home.     Problems: There are no problems to display for this patient.   Allergies: No Known Allergies Medications:  Current Outpatient Medications:    atovaquone-proguanil (MALARONE) 250-100 MG TABS tablet, Take 1 tablet by mouth daily. Start medication 2 days before travel and continue on trip and until 7 days after return., Disp: 20 tablet, Rfl: 0  Observations/Objective: Patient is well-developed, well-nourished in no acute distress.  Resting comfortably at home.  Head is normocephalic, atraumatic.  No labored breathing.  Speech is clear and coherent with logical content.  Patient is alert and oriented at baseline.    Assessment and Plan: 1. COVID-19 virus infection  Stay well hydrated Take otc medicine as directed on the box for cough and cold symptoms Reviewed CDC guidelines with patient. Pt will be isolated for 5 days from the start of his symptoms. Needs to be fever free for 24 hours without the aid of medicine before returning back to work. She must wear a mask for an additional 5 days.     Follow  Up Instructions: I discussed the assessment and treatment plan with the patient. The patient was provided an opportunity to ask questions and all were answered. The patient agreed with the plan and demonstrated an understanding of the instructions.  A copy of instructions were sent to the patient via MyChart unless otherwise noted below.    The patient was advised to call back or seek an in-person evaluation if the symptoms worsen or if  the condition fails to improve as anticipated.  Time:  I spent 10 minutes with the patient via telehealth technology discussing the above problems/concerns.    Mitchell Acosta, PA-C

## 2022-07-20 DIAGNOSIS — I2510 Atherosclerotic heart disease of native coronary artery without angina pectoris: Secondary | ICD-10-CM | POA: Insufficient documentation

## 2024-03-21 ENCOUNTER — Ambulatory Visit: Payer: Self-pay | Admitting: Dermatology

## 2024-03-21 ENCOUNTER — Encounter: Payer: Self-pay | Admitting: Dermatology

## 2024-03-21 DIAGNOSIS — Z1283 Encounter for screening for malignant neoplasm of skin: Secondary | ICD-10-CM | POA: Diagnosis not present

## 2024-03-21 DIAGNOSIS — D239 Other benign neoplasm of skin, unspecified: Secondary | ICD-10-CM

## 2024-03-21 DIAGNOSIS — D1801 Hemangioma of skin and subcutaneous tissue: Secondary | ICD-10-CM

## 2024-03-21 DIAGNOSIS — L821 Other seborrheic keratosis: Secondary | ICD-10-CM

## 2024-03-21 DIAGNOSIS — L578 Other skin changes due to chronic exposure to nonionizing radiation: Secondary | ICD-10-CM | POA: Diagnosis not present

## 2024-03-21 DIAGNOSIS — W908XXA Exposure to other nonionizing radiation, initial encounter: Secondary | ICD-10-CM

## 2024-03-21 DIAGNOSIS — D1721 Benign lipomatous neoplasm of skin and subcutaneous tissue of right arm: Secondary | ICD-10-CM

## 2024-03-21 DIAGNOSIS — L988 Other specified disorders of the skin and subcutaneous tissue: Secondary | ICD-10-CM

## 2024-03-21 DIAGNOSIS — L814 Other melanin hyperpigmentation: Secondary | ICD-10-CM

## 2024-03-21 DIAGNOSIS — D229 Melanocytic nevi, unspecified: Secondary | ICD-10-CM

## 2024-03-21 DIAGNOSIS — D171 Benign lipomatous neoplasm of skin and subcutaneous tissue of trunk: Secondary | ICD-10-CM

## 2024-03-21 NOTE — Patient Instructions (Addendum)
 Seborrheic Keratosis  What causes seborrheic keratoses? Seborrheic keratoses are harmless, common skin growths that first appear during adult life.  As time goes by, more growths appear.  Some people may develop a large number of them.  Seborrheic keratoses appear on both covered and uncovered body parts.  They are not caused by sunlight.  The tendency to develop seborrheic keratoses can be inherited.  They vary in color from skin-colored to gray, brown, or even black.  They can be either smooth or have a rough, warty surface.   Seborrheic keratoses are superficial and look as if they were stuck on the skin.  Under the microscope this type of keratosis looks like layers upon layers of skin.  That is why at times the top layer may seem to fall off, but the rest of the growth remains and re-grows.    Treatment Seborrheic keratoses do not need to be treated, but can easily be removed in the office.  Seborrheic keratoses often cause symptoms when they rub on clothing or jewelry.  Lesions can be in the way of shaving.  If they become inflamed, they can cause itching, soreness, or burning.  Removal of a seborrheic keratosis can be accomplished by freezing, burning, or surgery. If any spot bleeds, scabs, or grows rapidly, please return to have it checked, as these can be an indication of a skin cancer.  A dermatofibroma is a benign growth possibly related to trauma, such as an insect bite, cut from shaving, or inflamed acne-type bump.  Treatment options to remove include shave or excision with resulting scar and risk of recurrence.  Since benign-appearing and not bothersome, will observe for now.     Recommend daily broad spectrum sunscreen SPF 30+ to sun-exposed areas, reapply every 2 hours as needed. Call for new or changing lesions.  Staying in the shade or wearing long sleeves, sun glasses (UVA+UVB protection) and wide brim hats (4-inch brim around the entire circumference of the hat) are also recommended  for sun protection.   Melanoma ABCDEs  Melanoma is the most dangerous type of skin cancer, and is the leading cause of death from skin disease.  You are more likely to develop melanoma if you: Have light-colored skin, light-colored eyes, or red or blond hair Spend a lot of time in the sun Tan regularly, either outdoors or in a tanning bed Have had blistering sunburns, especially during childhood Have a close family member who has had a melanoma Have atypical moles or large birthmarks  Early detection of melanoma is key since treatment is typically straightforward and cure rates are extremely high if we catch it early.   The first sign of melanoma is often a change in a mole or a new dark spot.  The ABCDE system is a way of remembering the signs of melanoma.  A for asymmetry:  The two halves do not match. B for border:  The edges of the growth are irregular. C for color:  A mixture of colors are present instead of an even brown color. D for diameter:  Melanomas are usually (but not always) greater than 6mm - the size of a pencil eraser. E for evolution:  The spot keeps changing in size, shape, and color.  Please check your skin once per month between visits. You can use a small mirror in front and a large mirror behind you to keep an eye on the back side or your body.   If you see any new or changing lesions  before your next follow-up, please call to schedule a visit.  Please continue daily skin protection including broad spectrum sunscreen SPF 30+ to sun-exposed areas, reapplying every 2 hours as needed when you're outdoors.   Staying in the shade or wearing long sleeves, sun glasses (UVA+UVB protection) and wide brim hats (4-inch brim around the entire circumference of the hat) are also recommended for sun protection.     Due to recent changes in healthcare laws, you may see results of your pathology and/or laboratory studies on MyChart before the doctors have had a chance to review  them. We understand that in some cases there may be results that are confusing or concerning to you. Please understand that not all results are received at the same time and often the doctors may need to interpret multiple results in order to provide you with the best plan of care or course of treatment. Therefore, we ask that you please give us  2 business days to thoroughly review all your results before contacting the office for clarification. Should we see a critical lab result, you will be contacted sooner.   If You Need Anything After Your Visit  If you have any questions or concerns for your doctor, please call our main line at [phone] and press option 4 to reach your doctor's medical assistant. If no one answers, please leave a voicemail as directed and we will return your call as soon as possible. Messages left after 4 pm will be answered the following business day.   You may also send us  a message via MyChart. We typically respond to MyChart messages within 1-2 business days.  For prescription refills, please ask your pharmacy to contact our office. Our fax number is [phone].  If you have an urgent issue when the clinic is closed that cannot wait until the next business day, you can page your doctor at the number below.    Please note that while we do our best to be available for urgent issues outside of office hours, we are not available 24/7.   If you have an urgent issue and are unable to reach us , you may choose to seek medical care at your doctor's office, retail clinic, urgent care center, or emergency room.  If you have a medical emergency, please immediately call 911 or go to the emergency department.  Pager Numbers  - Dr. Hester: [phone]  - Dr. Jackquline: [phone]  - Dr. Claudene: [phone]   - Dr. Raymund: [phone]  In the event of inclement weather, please call our main line at [phone] for an update on the status of any delays or  closures.  Dermatology Medication Tips: Please keep the boxes that topical medications come in in order to help keep track of the instructions about where and how to use these. Pharmacies typically print the medication instructions only on the boxes and not directly on the medication tubes.   If your medication is too expensive, please contact our office at [phone] option 4 or send us  a message through MyChart.   We are unable to tell what your co-pay for medications will be in advance as this is different depending on your insurance coverage. However, we may be able to find a substitute medication at lower cost or fill out paperwork to get insurance to cover a needed medication.   If a prior authorization is required to get your medication covered by your insurance company, please allow us  1-2 business days to complete this process.  Drug  prices often vary depending on where the prescription is filled and some pharmacies may offer cheaper prices.  The website www.goodrx.com contains coupons for medications through different pharmacies. The prices here do not account for what the cost may be with help from insurance (it may be cheaper with your insurance), but the website can give you the price if you did not use any insurance.  - You can print the associated coupon and take it with your prescription to the pharmacy.  - You may also stop by our office during regular business hours and pick up a GoodRx coupon card.  - If you need your prescription sent electronically to a different pharmacy, notify our office through [facility] or by phone at [phone] option 4.     Si Usted Necesita Algo Despus de Su Visita  Tambin puede enviarnos un mensaje a travs de Clinical cytogeneticist. Por lo general respondemos a los mensajes de MyChart en el transcurso de 1 a 2 das hbiles.  Para renovar recetas, por favor pida a su farmacia que se ponga en contacto con nuestra oficina. Randi lakes de fax  es [place] [phone].  Si tiene un asunto urgente cuando la clnica est cerrada y que no puede esperar hasta el siguiente da hbil, puede llamar/localizar a su doctor(a) al nmero que aparece a continuacin.   Por favor, tenga en cuenta que aunque hacemos todo lo posible para estar disponibles para asuntos urgentes fuera del horario de [place], no estamos disponibles las 24 horas del da, los 7 [address]  [address] de la [place].   Si tiene un problema urgente y no puede comunicarse con nosotros, puede optar por buscar atencin mdica  en el consultorio de su doctor(a), en una clnica privada, en un centro de atencin urgente o en una sala de emergencias.  Si tiene Engineer, drilling, por favor llame inmediatamente al 911 o vaya a la sala de emergencias.  Nmeros de bper  - Dr. Hester: [phone]  - Dra. Jackquline: [phone]  - Dr. Claudene: [phone]  - Dra. Kitts: [phone]  En caso de inclemencias del [place], por favor llame a nuestra lnea principal al [phone] para una actualizacin sobre el estado de cualquier retraso o cierre.  Consejos para la medicacin en dermatologa: Por favor, guarde las cajas en las que vienen los medicamentos de uso tpico para ayudarle a seguir las instrucciones sobre dnde y cmo usarlos. Las farmacias generalmente imprimen las instrucciones del medicamento slo en las cajas y no directamente en los tubos del [place].   Si su medicamento es muy caro, por favor, pngase en contacto con landry rieger llamando al [phone] y presione la opcin 4 o envenos un mensaje a travs de Clinical cytogeneticist.   No podemos decirle cul ser su copago por los medicamentos por adelantado ya que esto es diferente dependiendo de la cobertura de su seguro. Sin embargo, es posible que podamos encontrar un medicamento sustituto a Audiological scientist un formulario para que el seguro cubra el medicamento que se considera necesario.   Si se requiere una autorizacin previa para  que su compaa de seguros malta su medicamento, por favor permtanos de 1 a 2 das hbiles para completar este proceso.  Los precios de los medicamentos varan con frecuencia dependiendo del Environmental consultant de dnde se surte la receta y alguna farmacias pueden ofrecer precios ms baratos.  El sitio web www.goodrx.com tiene cupones para medicamentos de Health and safety inspector. Los precios aqu no tienen en cuenta lo que podra costar con la ayuda del  seguro (puede ser ms barato con su seguro), pero el sitio web puede darle el precio si no Visual merchandiser.  - Puede imprimir el cupn correspondiente y llevarlo con su receta a la farmacia.  - Tambin puede pasar por nuestra oficina durante el horario de atencin regular y Education officer, museum una tarjeta de cupones de GoodRx.  - Si necesita que su receta se enve electrnicamente a una farmacia diferente, informe a nuestra oficina a travs de MyChart de [place] o por telfono llamando al [phone] y presione la opcin 4.

## 2024-03-21 NOTE — Progress Notes (Signed)
 New Patient Visit   Subjective  Mitchell Simon is a 63 y.o. male who presents for the following: Skin Cancer Screening and Full Body Skin Exam, no hx of skin cancer, check spots scalp/hairline, hx of LN2 in past  The patient presents for Total-Body Skin Exam (TBSE) for skin cancer screening and mole check. The patient has spots, moles and lesions to be evaluated, some may be new or changing and the patient may have concern these could be cancer.  The following portions of the chart were reviewed this encounter and updated as appropriate: medications, allergies, medical history  Review of Systems:  No other skin or systemic complaints except as noted in HPI or Assessment and Plan.  Objective  Well appearing patient in no apparent distress; mood and affect are within normal limits.  A full examination was performed including scalp, head, eyes, ears, nose, lips, neck, chest, axillae, abdomen, back, buttocks, bilateral upper extremities, bilateral lower extremities, hands, feet, fingers, toes, fingernails, and toenails. All findings within normal limits unless otherwise noted below.   Relevant physical exam findings are noted in the Assessment and Plan.    Assessment & Plan   SKIN CANCER SCREENING PERFORMED TODAY.  ACTINIC DAMAGE - Chronic condition, secondary to cumulative UV/sun exposure - diffuse scaly erythematous macules with underlying dyspigmentation - Recommend daily broad spectrum sunscreen SPF 30+ to sun-exposed areas, reapply every 2 hours as needed.  - Staying in the shade or wearing long sleeves, sun glasses (UVA+UVB protection) and wide brim hats (4-inch brim around the entire circumference of the hat) are also recommended for sun protection.  - Call for new or changing lesions.  LENTIGINES, SEBORRHEIC KERATOSES, HEMANGIOMAS - Benign normal skin lesions - Benign-appearing - Call for any changes  MELANOCYTIC NEVI - Tan-brown and/or pink-flesh-colored symmetric macules  and papules - Benign appearing on exam today - Observation - Call clinic for new or changing moles - Recommend daily use of broad spectrum spf 30+ sunscreen to sun-exposed areas.   SEBORRHEIC KERATOSIS Scalp, hairline - Stuck-on, waxy, tan-brown papules and/or plaques  - Benign-appearing - Discussed benign etiology and prognosis. - Observe - Call for any changes  Lipoma  R forearm, abdomen Exam: Subcutaneous rubbery nodule(s) Location: R forearm, abdomen  Benign-appearing. Exam most consistent with an Lipoma. Discussed that a Lipoma is a benign fatty growth that can grow over time and sometimes become painful or otherwise symptomatic. Some patients may have one or several lipomas.. Benign Hereditary Lipomatosis is a hereditary familial condition where family members tend to grow multiple lipomas.  Recommend observation if it is not changing, growing or symptomatic. Recommend surgical excision to remove it if it is painful, growing, symptomatic, or other changes noted. Please contact our office for new or changing lesions so they can be evaluated.   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  Observation.  Call clinic for new or changing moles.  Recommend daily use of broad spectrum spf 30+ sunscreen to sun-exposed areas.    Suspected DERMATOFIBROMA Exam: 9.86mm pink to brown macule with slight sq induration L lower medial leg  Treatment Plan: A dermatofibroma is a benign growth possibly related to trauma, such as an insect bite, cut from shaving, or inflamed acne-type bump.  Treatment options to remove include shave or excision with resulting scar and risk of recurrence.  Since benign-appearing and not bothersome, will observe for now.   MULTIPLE BENIGN NEVI   LENTIGINES  ACTINIC ELASTOSIS   SEBORRHEIC KERATOSES   CHERRY ANGIOMA   LIPOMA OF RIGHT UPPER EXTREMITY   LIPOMA OF  TORSO   DERMATOFIBROMA    Return in 6 months (on 09/18/2024) for recheck Dermatofibroma, 1 yr TBSE.  I, Grayce Saunas, RMA, am acting as scribe for Boneta Sharps, MD .   Documentation: I have reviewed the above documentation for accuracy and completeness, and I agree with the above.  Boneta Sharps, MD

## 2024-09-19 ENCOUNTER — Ambulatory Visit: Admitting: Dermatology

## 2025-03-25 ENCOUNTER — Ambulatory Visit: Admitting: Dermatology
